# Patient Record
Sex: Male | Born: 1970 | Hispanic: Yes | Marital: Single | Smoking: Never smoker
Health system: Southern US, Community
[De-identification: ages and names within clinical notes are randomized; demographics above are authoritative.]

## PROBLEM LIST (undated history)

## (undated) DIAGNOSIS — E119 Type 2 diabetes mellitus without complications: Secondary | ICD-10-CM

---

## 2006-09-02 ENCOUNTER — Emergency Department: Payer: Self-pay | Admitting: Unknown Physician Specialty

## 2018-04-20 ENCOUNTER — Emergency Department: Payer: Self-pay

## 2018-04-20 ENCOUNTER — Emergency Department
Admission: EM | Admit: 2018-04-20 | Discharge: 2018-04-20 | Disposition: A | Discharge status: Home / Self Care | Payer: Self-pay | Attending: Emergency Medicine | Admitting: Emergency Medicine

## 2018-04-20 ENCOUNTER — Other Ambulatory Visit: Payer: Self-pay

## 2018-04-20 DIAGNOSIS — W172XXA Fall into hole, initial encounter: Secondary | ICD-10-CM | POA: Insufficient documentation

## 2018-04-20 DIAGNOSIS — T1490XA Injury, unspecified, initial encounter: Secondary | ICD-10-CM

## 2018-04-20 DIAGNOSIS — Y92007 Garden or yard of unspecified non-institutional (private) residence as the place of occurrence of the external cause: Secondary | ICD-10-CM | POA: Insufficient documentation

## 2018-04-20 DIAGNOSIS — S20212A Contusion of left front wall of thorax, initial encounter: Secondary | ICD-10-CM | POA: Insufficient documentation

## 2018-04-20 DIAGNOSIS — Y998 Other external cause status: Secondary | ICD-10-CM | POA: Insufficient documentation

## 2018-04-20 DIAGNOSIS — Y9389 Activity, other specified: Secondary | ICD-10-CM | POA: Insufficient documentation

## 2018-04-20 HISTORY — DX: Type 2 diabetes mellitus without complications: E11.9

## 2018-04-20 MED ORDER — CYCLOBENZAPRINE HCL 5 MG PO TABS: 5.0000 mg | ORAL_TABLET | Freq: Three times a day (TID) | ORAL | 0 refills | Status: AC | PRN

## 2018-04-20 NOTE — ED Triage Notes (Signed)
Pt was blowing leaves and fell over because he stepped in a hole. States leaf blower hit L chest. Only c/o pain there. States blowing nose and coughing hurts that area. A&O, ambulatory.

## 2018-04-20 NOTE — Discharge Instructions (Signed)
Your x-ray did not show a fracture to the ribs or injury to the lungs. You have some chest muscle pain, bruising, and spasms. Put ice and/or moist heat to the chest wall. Take OTC Motrin (ibuprofen) 4 pills with food: morning, midday, and nighttime. Take the prescription muscle relaxant as needed.   La radiografa no mostr Market researcheruna fractura en las costillas ni una lesin en los pulmones. Tiene dolor muscular en el pecho, hematomas y espasmos. Ponga hielo y/o calor hmedo en la pared torcica. Tome OTC Motrin (ibuprofeno) 4 pldoras con alimentos: por la maana, al medioda y durante la noche. Tome el relajante muscular recetado segn sea necesario.

## 2018-04-23 NOTE — ED Provider Notes (Signed)
[facility] Emergency Department Provider Note ____________________________________________  Time seen: 1135  I have reviewed the triage vital signs and the nursing notes.  HISTORY  Chief Complaint  Fall  HPI Vincent Jones is a 48 y.o. male presents himself to the ED for evaluation of continued left anterior chest wall pain.  Patient describes a few days earlier he was blowing leaves with a backpack blower, when he fell into a large hole in the yard caused by a recently cut down tree.  Area was covered with leaves, and when he fell, he landed with the blower extension hitting his left pectoralis muscle.  Since that time he has had increasing pain to that area.  He describes increased pain with coughing and blowing his nose.  He denies any hemoptysis, nausea, vomiting, or dizziness patient also denies any abrasion or laceration to the chest wall.  Past Medical History:  Diagnosis Date  . Diabetes mellitus without complication (HCC)     There are no active problems to display for this patient.   History reviewed. No pertinent surgical history.  Prior to Admission medications   Medication Sig Start Date End Date Taking? Authorizing Provider  cyclobenzaprine (FLEXERIL) 5 MG tablet Take 1 tablet (5 mg total) by mouth 3 (three) times daily as needed for muscle spasms. 04/20/18   Audiel Scheiber, Charlesetta Ivory, PA-C    Allergies Patient has no known allergies.  History reviewed. No pertinent family history.  Social History Social History   Tobacco Use  . Smoking status: Never Smoker  Substance Use Topics  . Alcohol use: Yes    Comment: social   . Drug use: Not on file    Review of Systems  Constitutional: Negative for fever. Cardiovascular: Negative for chest pain. Respiratory: Negative for shortness of breath. Gastrointestinal: Negative for abdominal pain, vomiting and diarrhea. Genitourinary: Negative for dysuria. Musculoskeletal: Negative for back  pain..  Left chest wall pain as above. Skin: Negative for rash. Neurological: Negative for headaches, focal weakness or numbness. ____________________________________________  PHYSICAL EXAM:  VITAL SIGNS: ED Triage Vitals  Enc Vitals Group     BP 04/20/18 1049 (!) 127/111     Pulse Rate 04/20/18 1049 (!) 59     Resp 04/20/18 1049 16     Temp 04/20/18 1049 98.2 F (36.8 C)     Temp Source 04/20/18 1049 Oral     SpO2 04/20/18 1049 100 %     Weight 04/20/18 1050 155 lb (70.3 kg)     Height 04/20/18 1050 5\' 8"  (1.727 m)     Head Circumference --      Peak Flow --      Pain Score 04/20/18 1050 7     Pain Loc --      Pain Edu? --      Excl. in GC? --     Constitutional: Alert and oriented. Well appearing and in no distress. Head: Normocephalic and atraumatic. Eyes: Conjunctivae are normal. Normal extraocular movements Neck: Supple. Normal ROM Cardiovascular: Normal rate, regular rhythm. Normal distal pulses. Respiratory: As well without any obvious deformity, dislocation, abrasion, or ecchymosis.  Patient with some subtle soft tissue swelling over the left pectoralis musculature.  He is tender to palpation in the same region, and engagement the bicep increases his pain.  normal respiratory effort. No wheezes/rales/rhonchi. Gastrointestinal: Soft and nontender. No distention. Musculoskeletal: Nontender with normal range of motion in all extremities.  Neurologic:  Normal gait without ataxia. Normal speech and language. No  gross focal neurologic deficits are appreciated. Skin:  Skin is warm, dry and intact. No rash noted. ____________________________________________   RADIOLOGY  Left Rib Detail w/ CXR  IMPRESSION: No evident rib fracture.  No pneumothorax.  Lungs clear. ____________________________________________  PROCEDURES  Procedures ____________________________________________  INITIAL IMPRESSION / ASSESSMENT AND PLAN / ED COURSE  Patient with ED evaluation of a  chest wall contusion after slip and fall while wearing a backpack blower.  Patient is reassured by his chest x-ray findings.  Symptoms are consistent with a deep chest muscle contusion.  He will be discharged with a prescription for muscle relaxants take as directed.  He can continue to take over-the-counter anti-inflammatories.  He is also to apply ice and/or moist heat to the area to reduce symptoms. ____________________________________________  FINAL CLINICAL IMPRESSION(S) / ED DIAGNOSES  Final diagnoses:  Chest wall contusion, right, initial encounter      Lissa HoardMenshew, Jacoba Cherney V Bacon, PA-C 04/23/18 1806    Myrna BlazerSchaevitz, David Matthew, MD 04/24/18 [phone]

## 2019-07-28 ENCOUNTER — Other Ambulatory Visit: Payer: Self-pay

## 2019-07-28 ENCOUNTER — Emergency Department
Admission: EM | Admit: 2019-07-28 | Discharge: 2019-07-28 | Disposition: A | Discharge status: Home / Self Care | Payer: Self-pay | Attending: Emergency Medicine | Admitting: Emergency Medicine

## 2019-07-28 ENCOUNTER — Encounter: Payer: Self-pay | Admitting: *Deleted

## 2019-07-28 ENCOUNTER — Emergency Department: Payer: Self-pay

## 2019-07-28 DIAGNOSIS — Y92017 Garden or yard in single-family (private) house as the place of occurrence of the external cause: Secondary | ICD-10-CM | POA: Insufficient documentation

## 2019-07-28 DIAGNOSIS — S0990XA Unspecified injury of head, initial encounter: Secondary | ICD-10-CM

## 2019-07-28 DIAGNOSIS — E119 Type 2 diabetes mellitus without complications: Secondary | ICD-10-CM | POA: Insufficient documentation

## 2019-07-28 DIAGNOSIS — Z23 Encounter for immunization: Secondary | ICD-10-CM | POA: Insufficient documentation

## 2019-07-28 DIAGNOSIS — W14XXXA Fall from tree, initial encounter: Secondary | ICD-10-CM | POA: Insufficient documentation

## 2019-07-28 DIAGNOSIS — W19XXXA Unspecified fall, initial encounter: Secondary | ICD-10-CM

## 2019-07-28 DIAGNOSIS — R0789 Other chest pain: Secondary | ICD-10-CM

## 2019-07-28 DIAGNOSIS — Y999 Unspecified external cause status: Secondary | ICD-10-CM | POA: Insufficient documentation

## 2019-07-28 DIAGNOSIS — Y93H9 Activity, other involving exterior property and land maintenance, building and construction: Secondary | ICD-10-CM | POA: Insufficient documentation

## 2019-07-28 DIAGNOSIS — S0181XA Laceration without foreign body of other part of head, initial encounter: Secondary | ICD-10-CM | POA: Insufficient documentation

## 2019-07-28 MED ORDER — LIDOCAINE HCL (PF) 1 % IJ SOLN
5.0000 mL | Freq: Once | INTRAMUSCULAR | Status: DC
  Filled 2019-07-28: qty 5

## 2019-07-28 MED ORDER — METOCLOPRAMIDE HCL 10 MG PO TABS
10.0000 mg | ORAL_TABLET | Freq: Once | ORAL | Status: AC
  Administered 2019-07-28: 23:00:00 10 mg via ORAL
  Filled 2019-07-28: qty 1

## 2019-07-28 MED ORDER — TETANUS-DIPHTH-ACELL PERTUSSIS 5-2.5-18.5 LF-MCG/0.5 IM SUSP
0.5000 mL | Freq: Once | INTRAMUSCULAR | Status: AC
  Administered 2019-07-28: 23:00:00 0.5 mL via INTRAMUSCULAR
  Filled 2019-07-28: qty 0.5

## 2019-07-28 MED ORDER — ACETAMINOPHEN 325 MG PO TABS
650.0000 mg | ORAL_TABLET | Freq: Once | ORAL | Status: AC
  Administered 2019-07-28: 650 mg via ORAL
  Filled 2019-07-28: qty 2

## 2019-07-28 NOTE — Discharge Instructions (Signed)
Your exam, CT scans of the head/neck and chest XR are negative for any fractures or serious head injury. Take OTC Tylenol and Motrin as needed for pain. Follow-up with [facility] for suture removal in 3-5 days.

## 2019-07-28 NOTE — ED Triage Notes (Addendum)
Pt was cutting tree limbs and fell out of a tree approx 8 feet.  No loc.  Lac to forehead  Bleeding controlled.  When pt fell, pt has back/chest discomfort from hitting the ground.  No loc. No vomiting  No neck pain.  Pt alert  Speech clear.

## 2019-07-28 NOTE — ED Provider Notes (Signed)
[facility] Emergency Department Provider Note ____________________________________________  Time seen: 2141  I have reviewed the triage vital signs and the nursing notes.  HISTORY  Chief Complaint  Head Injury  HPI Vincent Jones is a 49 y.o. male presents to the ED for evaluation of injury sustained following a 3 to 5 foot fall from a tree.  Patient was at home by himself, attempting to cut a limb, when he apparently lost balance and fell out of the tree.  He describes landing on his back.  He presents with a laceration to his right forehead, and some pain to the anterior chest.  Patient denies  any loss of consciousness but remembers being dazed.  He denies any neck pain, back pain, abdominal pain, or any loss of bladder bowel control.  Past Medical History:  Diagnosis Date  . Diabetes mellitus without complication (HCC)     There are no problems to display for this patient.   History reviewed. No pertinent surgical history.  Prior to Admission medications   Medication Sig Start Date End Date Taking? Authorizing Provider  cyclobenzaprine (FLEXERIL) 5 MG tablet Take 1 tablet (5 mg total) by mouth 3 (three) times daily as needed for muscle spasms. 04/20/18   Sebert Stollings, Charlesetta Ivory, PA-C    Allergies Patient has no known allergies.  History reviewed. No pertinent family history.  Social History Social History   Tobacco Use  . Smoking status: Never Smoker  . Smokeless tobacco: Never Used  Substance Use Topics  . Alcohol use: Yes    Comment: social   . Drug use: Not Currently    Review of Systems  Constitutional: Negative for fever. Eyes: Negative for visual changes. ENT: Negative for sore throat. Cardiovascular: Negative for chest pain. Respiratory: Negative for shortness of breath. Gastrointestinal: Negative for abdominal pain, vomiting and diarrhea. Genitourinary: Negative for dysuria. Musculoskeletal: Negative for back pain.  Chest  wall pain as above. Skin: Negative for rash.  Facial laceration as above. Neurological: Negative for headaches, focal weakness or numbness. ____________________________________________  PHYSICAL EXAM:  VITAL SIGNS: ED Triage Vitals  Enc Vitals Group     BP 07/28/19 1952 (!) 146/80     Pulse Rate 07/28/19 1952 71     Resp 07/28/19 1952 18     Temp 07/28/19 1952 97.8 F (36.6 C)     Temp Source 07/28/19 1952 Oral     SpO2 07/28/19 1952 99 %     Weight 07/28/19 1953 150 lb (68 kg)     Height 07/28/19 1953 5\' 4"  (1.626 m)     Head Circumference --      Peak Flow --      Pain Score 07/28/19 1953 6     Pain Loc --      Pain Edu? --      Excl. in GC? --     Constitutional: Alert and oriented. Well appearing and in no distress.  GCS = 15 Head: Normocephalic and atraumatic, except for a irregular three-point stellate laceration to the right forehead.  No battle sign. Eyes: Conjunctivae are normal. PERRL. Normal extraocular movements.  No periorbital ecchymosis  Ears: Canals clear. TMs intact bilaterally.  No hemotympanums Nose: No congestion/rhinorrhea/epistaxis. Neck: Supple.  Normal range of motion without crepitus.  No distracting midline tenderness is noted. Cardiovascular: Normal rate, regular rhythm. Normal distal pulses. Respiratory: Normal respiratory effort. No wheezes/rales/rhonchi.  No chest wall deformity, bruise, ecchymosis, or abrasion.  No flail chest or crepitus is appreciated.  Gastrointestinal: Soft and nontender. No distention. Musculoskeletal: Nontender with normal range of motion in all extremities.  Neurologic: Cranial nerves II through XII grossly intact.  Normal gait without ataxia. Normal speech and language. No gross focal neurologic deficits are appreciated. Skin:  Skin is warm, dry and intact. No rash noted. Psychiatric: Mood and affect are normal. Patient exhibits appropriate insight and judgment. ____________________________________________    RADIOLOGY  CT Head/Cervical Spine w/o CM IMPRESSION: 1. No acute intracranial abnormality. 2. No acute fracture or static subluxation of the cervical spine.  CXR IMPRESSION: Low lung volumes without acute abnormality. ____________________________________________  PROCEDURES  Tylenol 650 mg p.o. Reglan 10 mg p.o.  Marland Kitchen.Laceration Repair  Date/Time: 07/28/2019 10:46 PM Performed by: Melvenia Needles, PA-C Authorized by: Melvenia Needles, PA-C   Consent:    Consent obtained:  Verbal   Consent given by:  Patient   Risks discussed:  Poor cosmetic result Anesthesia (see MAR for exact dosages):    Anesthesia method:  Local infiltration   Local anesthetic:  Lidocaine 1% w/o epi Laceration details:    Location:  Face   Face location:  Forehead   Length (cm):  3 Repair type:    Repair type:  Simple Pre-procedure details:    Preparation:  Patient was prepped and draped in usual sterile fashion Treatment:    Area cleansed with:  Saline and Betadine   Amount of cleaning:  Standard   Irrigation method:  Syringe Skin repair:    Repair method:  Sutures   Suture size:  6-0   Suture material:  Nylon   Suture technique:  Simple interrupted   Number of sutures:  7 Approximation:    Approximation:  Close Post-procedure details:    Dressing:  Open (no dressing)   Patient tolerance of procedure:  Tolerated well, no immediate complications   ____________________________________________  INITIAL IMPRESSION / ASSESSMENT AND PLAN / ED COURSE  Patient with ED evaluation of injury sustained following a accidental fall from a height of 3 to 5 feet.  Patient describes falling out of a tree that he was attempting to cut a limb out of.  He fell onto his back, but sustained a laceration to his forehead.  Patient reports being dazed but denies any LOC.  Exam is overall benign reassuring at this time.  Patient is alert, oriented, and loquacious during exam.  His wife verbalizes  that this is his normal level of cognition and alertness.  The facial laceration is repaired using sutures, the patient is cleared with normal head and neck CTs and chest x-ray.  He is discharged to take over-the-counter Tylenol Motrin as needed for pain and inflammation.  He will follow with primary provider or local urgent care for suture removal in 3 to 5 days.  Vincent Jones was evaluated in Emergency Department on 07/29/2019 for the symptoms described in the history of present illness. He was evaluated in the context of the global COVID-19 pandemic, which necessitated consideration that the patient might be at risk for infection with the SARS-CoV-2 virus that causes COVID-19. Institutional protocols and algorithms that pertain to the evaluation of patients at risk for COVID-19 are in a state of rapid change based on information released by regulatory bodies including the CDC and federal and state organizations. These policies and algorithms were followed during the patient's care in the ED. ____________________________________________  FINAL CLINICAL IMPRESSION(S) / ED DIAGNOSES  Final diagnoses:  Closed head injury, initial encounter  Fall, initial encounter  Acute chest wall pain      Deanie Jupiter, Charlesetta Ivory, PA-C 07/29/19 0040    Shaune Pollack, MD 07/29/19 [phone]

## 2021-04-05 IMAGING — CT CT CERVICAL SPINE W/O CM
3 of 4 series · 9 of 33 positions shown, 11 images · non-contrast
Comparison: None.

CLINICAL DATA: Fall from tree

EXAM:
CT HEAD WITHOUT CONTRAST
CT CERVICAL SPINE WITHOUT CONTRAST
TECHNIQUE: Multidetector CT imaging of the head and cervical spine was
performed following the standard protocol without intravenous
contrast. Multiplanar CT image reconstructions of the cervical spine
were also generated.

[Series 5: sagittal bone · sagittal · 0.22mm/px · 5 of 61 slices shown, 6 images]
[im 21/61  bone]
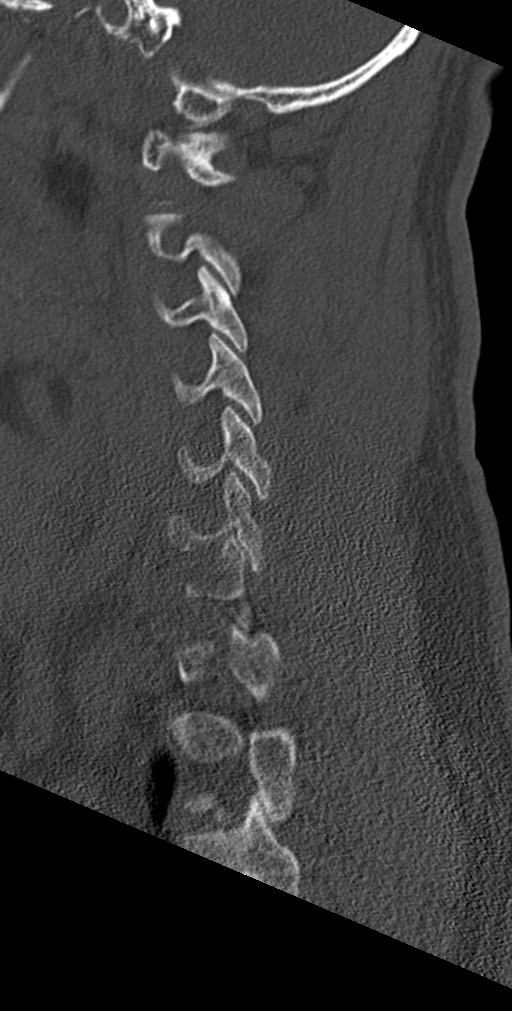
[im 26/61  bone]
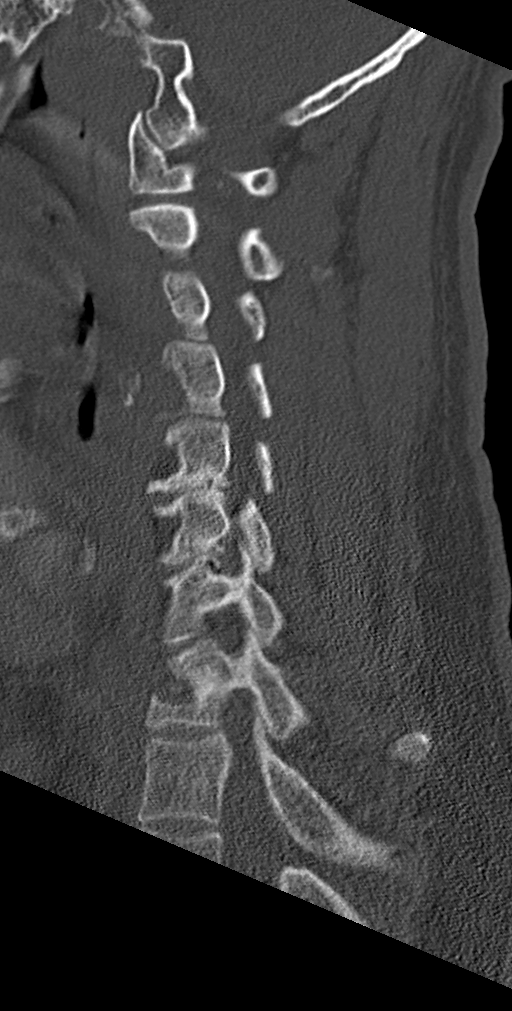
[im 31/61  soft-tissue]
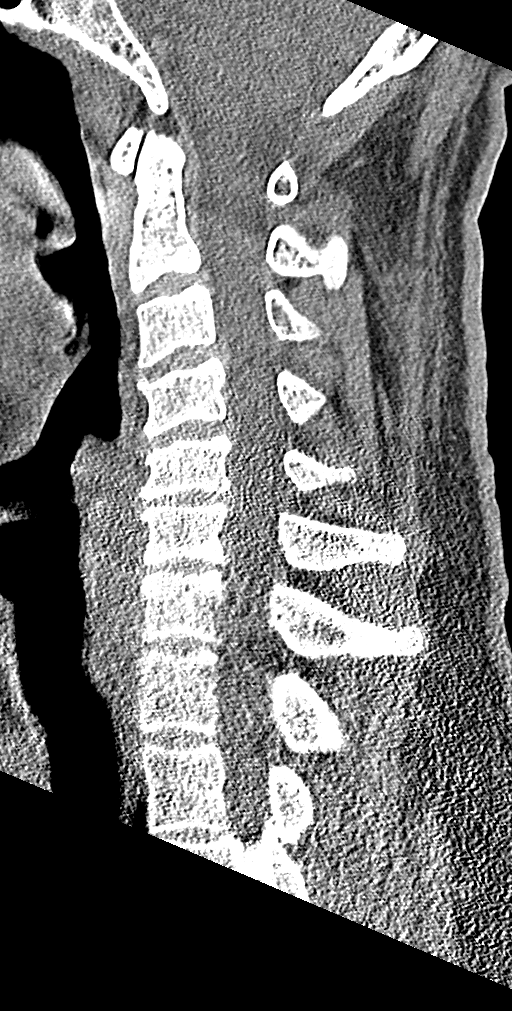
[im 31/61  bone]
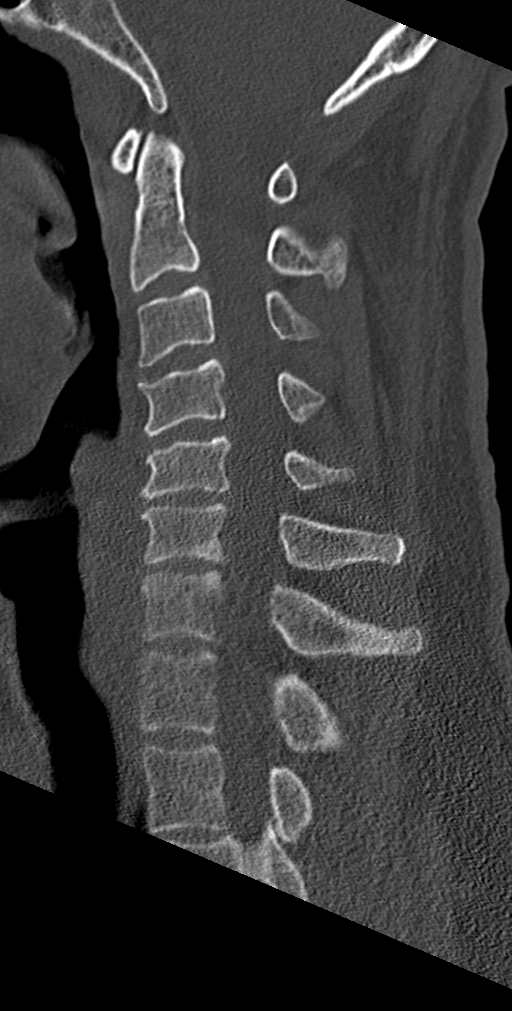
[im 36/61  bone]
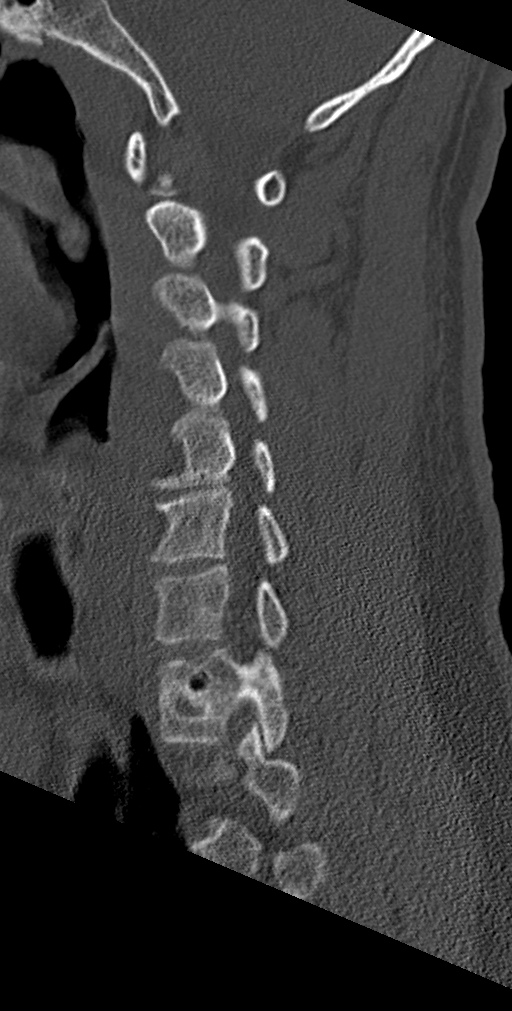
[im 41/61  bone]
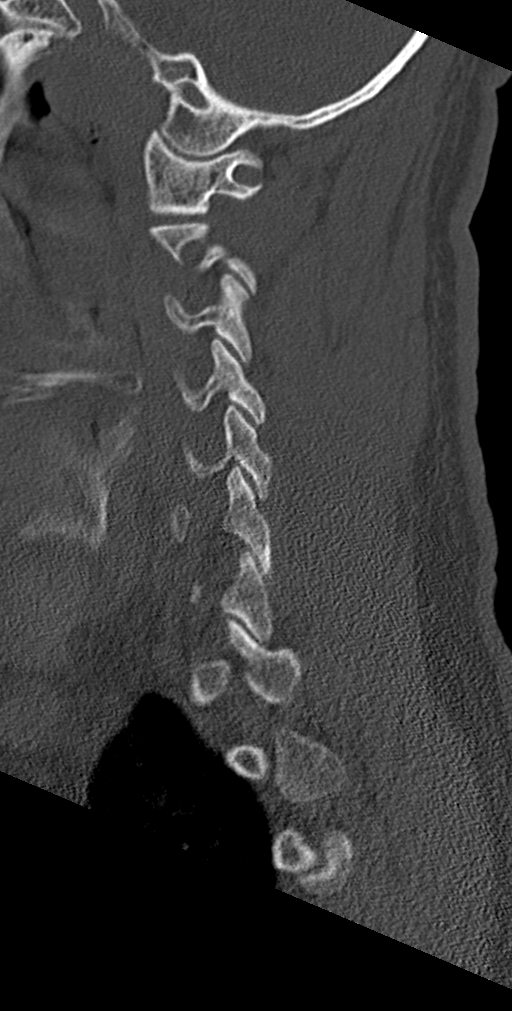

[Series 7: orthogonal bone · axial · 0.20mm/px · z∈[+13,+13]mm · 1 of 108 slices shown, 2 images]
[im 54/108  soft-tissue]
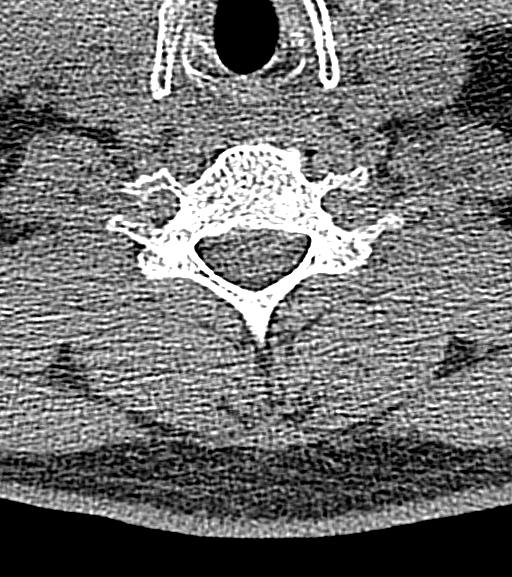
[im 54/108  bone]
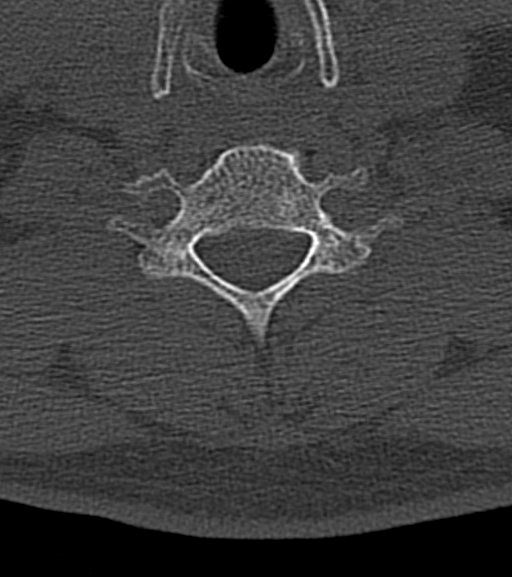

[Series 10: coronal bone · coronal · 0.23mm/px · 3 of 50 slices shown]
[im 10/50  bone]
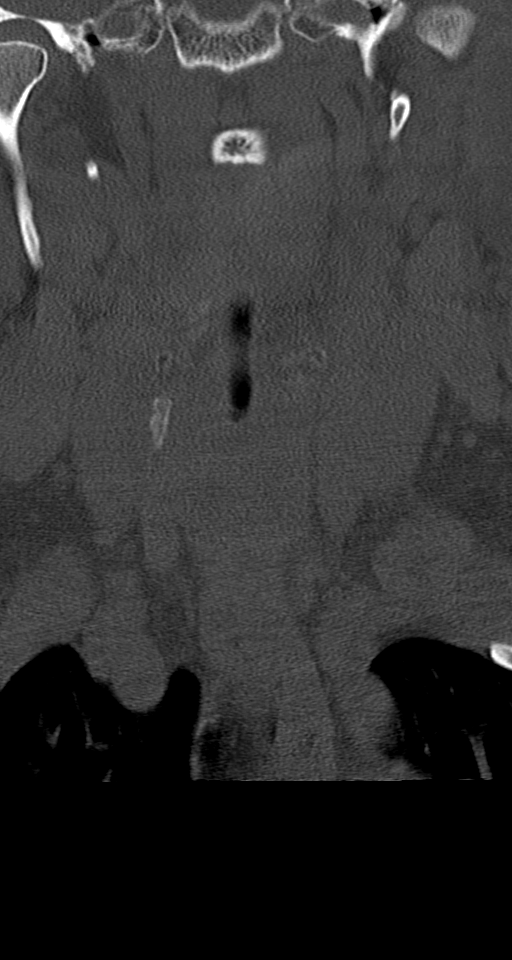
[im 20/50  bone]
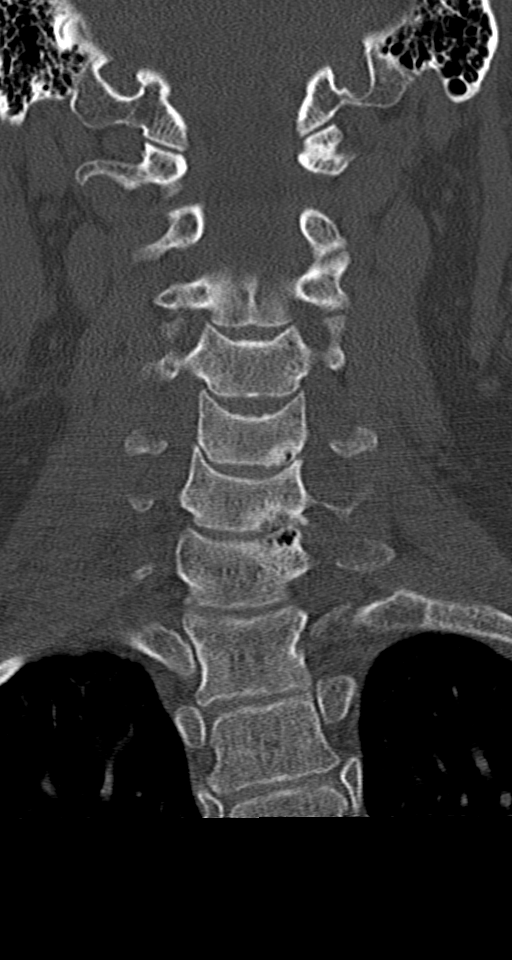
[im 30/50  bone]
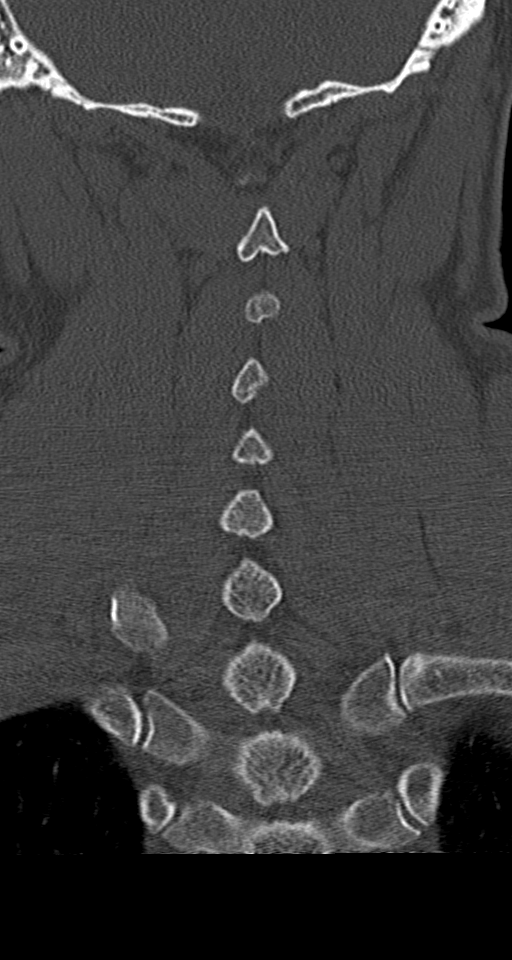

[9 of 33 positions shown; findings below may reference images not displayed]

FINDINGS: CT HEAD FINDINGS

Brain: There is no mass, hemorrhage or extra-axial collection. The
size and configuration of the ventricles and extra-axial CSF spaces
are normal. The brain parenchyma is normal, without evidence of
acute or chronic infarction.

Vascular: No abnormal hyperdensity of the major intracranial
arteries or dural venous sinuses. No intracranial atherosclerosis.

Skull: The visualized skull base, calvarium and extracranial soft
tissues are normal.

Sinuses/Orbits: No fluid levels or advanced mucosal thickening of
the visualized paranasal sinuses. No mastoid or middle ear effusion.
The orbits are normal.

CT CERVICAL SPINE FINDINGS

Alignment: No static subluxation. Facets are aligned. Occipital
condyles are normally positioned.

Skull base and vertebrae: No acute fracture.

Soft tissues and spinal canal: No prevertebral fluid or swelling. No
visible canal hematoma.

Disc levels: No advanced spinal canal or neural foraminal stenosis.

Upper chest: No pneumothorax, pulmonary nodule or pleural effusion.

Other: Normal visualized paraspinal cervical soft tissues.
IMPRESSION: 1. No acute intracranial abnormality.
2. No acute fracture or static subluxation of the cervical spine.
